# Patient Record
Sex: Female | Born: 1985 | Race: Black or African American | Hispanic: No | Marital: Single | Smoking: Never smoker
Health system: Southern US, Community
[De-identification: ages and names within clinical notes are randomized; demographics above are authoritative.]

---

## 2010-06-28 ENCOUNTER — Emergency Department (HOSPITAL_COMMUNITY)
Admission: EM | Admit: 2010-06-28 | Discharge: 2010-06-29 | Disposition: A | Discharge status: Home / Self Care | Payer: Self-pay | Attending: Emergency Medicine | Admitting: Emergency Medicine

## 2010-06-28 DIAGNOSIS — R109 Unspecified abdominal pain: Secondary | ICD-10-CM | POA: Insufficient documentation

## 2010-06-28 DIAGNOSIS — R110 Nausea: Secondary | ICD-10-CM | POA: Insufficient documentation

## 2010-06-28 LAB — DIFFERENTIAL
Eosinophils Absolute: 0 10*3/uL (ref 0.0–0.7)
Eosinophils Relative: 0 % (ref 0–5)
Lymphocytes Relative: 42 % (ref 12–46)
Lymphs Abs: 2.2 10*3/uL (ref 0.7–4.0)
Monocytes Absolute: 0.3 10*3/uL (ref 0.1–1.0)
Monocytes Relative: 6 % (ref 3–12)

## 2010-06-28 LAB — URINALYSIS, ROUTINE W REFLEX MICROSCOPIC
Bilirubin Urine: NEGATIVE
Ketones, ur: >80 mg/dL — AB
Nitrite: NEGATIVE
Specific Gravity, Urine: 1.021 (ref 1.005–1.030)
Urobilinogen, UA: 0.2 mg/dL (ref 0.0–1.0)

## 2010-06-28 LAB — COMPREHENSIVE METABOLIC PANEL
Alkaline Phosphatase: 58 U/L (ref 39–117)
BUN: 7 mg/dL (ref 6–23)
Creatinine, Ser: 0.81 mg/dL (ref 0.4–1.2)
Glucose, Bld: 101 mg/dL — ABNORMAL HIGH (ref 70–99)
Potassium: 3.3 mEq/L — ABNORMAL LOW (ref 3.5–5.1)
Total Protein: 7.1 g/dL (ref 6.0–8.3)

## 2010-06-28 LAB — CBC
HCT: 37.7 % (ref 36.0–46.0)
MCH: 28.2 pg (ref 26.0–34.0)
MCHC: 34 g/dL (ref 30.0–36.0)
MCV: 83 fL (ref 78.0–100.0)
Platelets: 300 10*3/uL (ref 150–400)
RDW: 16 % — ABNORMAL HIGH (ref 11.5–15.5)
WBC: 5.3 10*3/uL (ref 4.0–10.5)

## 2010-06-28 LAB — POCT PREGNANCY, URINE: Preg Test, Ur: NEGATIVE

## 2010-06-28 LAB — LIPASE, BLOOD: Lipase: 25 U/L (ref 11–59)

## 2012-10-19 ENCOUNTER — Encounter (HOSPITAL_BASED_OUTPATIENT_CLINIC_OR_DEPARTMENT_OTHER): Payer: Self-pay | Admitting: *Deleted

## 2012-10-19 ENCOUNTER — Emergency Department (HOSPITAL_BASED_OUTPATIENT_CLINIC_OR_DEPARTMENT_OTHER): Payer: Self-pay

## 2012-10-19 ENCOUNTER — Emergency Department (HOSPITAL_BASED_OUTPATIENT_CLINIC_OR_DEPARTMENT_OTHER)
Admission: EM | Admit: 2012-10-19 | Discharge: 2012-10-19 | Disposition: A | Discharge status: Home / Self Care | Payer: Self-pay | Attending: Emergency Medicine | Admitting: Emergency Medicine

## 2012-10-19 DIAGNOSIS — R42 Dizziness and giddiness: Secondary | ICD-10-CM | POA: Insufficient documentation

## 2012-10-19 DIAGNOSIS — R61 Generalized hyperhidrosis: Secondary | ICD-10-CM | POA: Insufficient documentation

## 2012-10-19 DIAGNOSIS — R51 Headache: Secondary | ICD-10-CM | POA: Insufficient documentation

## 2012-10-19 DIAGNOSIS — R0602 Shortness of breath: Secondary | ICD-10-CM | POA: Insufficient documentation

## 2012-10-19 DIAGNOSIS — R55 Syncope and collapse: Secondary | ICD-10-CM | POA: Insufficient documentation

## 2012-10-19 DIAGNOSIS — Z3202 Encounter for pregnancy test, result negative: Secondary | ICD-10-CM | POA: Insufficient documentation

## 2012-10-19 LAB — COMPREHENSIVE METABOLIC PANEL
AST: 14 U/L (ref 0–37)
Albumin: 3.3 g/dL — ABNORMAL LOW (ref 3.5–5.2)
CO2: 25 mEq/L (ref 19–32)
Calcium: 9.1 mg/dL (ref 8.4–10.5)
Creatinine, Ser: 0.7 mg/dL (ref 0.50–1.10)
GFR calc non Af Amer: >90 mL/min (ref >90)

## 2012-10-19 LAB — URINALYSIS, ROUTINE W REFLEX MICROSCOPIC
Bilirubin Urine: NEGATIVE
Hgb urine dipstick: NEGATIVE
Protein, ur: NEGATIVE mg/dL
Urobilinogen, UA: 0.2 mg/dL (ref 0.0–1.0)

## 2012-10-19 LAB — CBC WITH DIFFERENTIAL/PLATELET
Basophils Absolute: 0 10*3/uL (ref 0.0–0.1)
Basophils Relative: 0 % (ref 0–1)
Eosinophils Absolute: 0.1 10*3/uL (ref 0.0–0.7)
Eosinophils Relative: 1 % (ref 0–5)
HCT: 40.6 % (ref 36.0–46.0)
MCH: 31.2 pg (ref 26.0–34.0)
MCHC: 35.5 g/dL (ref 30.0–36.0)
MCV: 88.1 fL (ref 78.0–100.0)
Monocytes Absolute: 0.5 10*3/uL (ref 0.1–1.0)
RDW: 12.5 % (ref 11.5–15.5)

## 2012-10-19 MED ORDER — KETOROLAC TROMETHAMINE 30 MG/ML IJ SOLN
30.0000 mg | Freq: Once | INTRAMUSCULAR | Status: AC
  Administered 2012-10-19: 30 mg via INTRAVENOUS
  Filled 2012-10-19: qty 1

## 2012-10-19 MED ORDER — SODIUM CHLORIDE 0.9 % IV BOLUS (SEPSIS)
1000.0000 mL | Freq: Once | INTRAVENOUS | Status: AC
  Administered 2012-10-19: 1000 mL via INTRAVENOUS

## 2012-10-19 NOTE — ED Provider Notes (Signed)
This chart was scribed for Glynn Octave, MD, by Candelaria Stagers, ED Scribe. This patient was seen in room MH02/MH02 and the patient's care was started at 3:06 PM   History    CSN: [id number] Arrival date & time 10/19/12  1350  First MD Initiated Contact with Patient 10/19/12 1502     Chief Complaint  Patient presents with  . Anxiety    The history is provided by the patient. No language interpreter was used.   HPI Comments: Megan Alexander is a 27 y.o. female who presents to the Emergency Department BIBA complaining of an episode of SOB, diaphoresis, lightheadedness, and near syncope that started earlier today while seated at her desk at work earlier today lasting about 30 min.  Pt reports that once she sat down and the EMS arrived her breathing improved.  She reports she is experiencing a headache that started this morning upon waking which is unchanged.  Pt denies chest pain, abdominal pain, or syncope.  She reports eating and drinking normally.  She uses implanon birth control.  Pt denies h/o anxiety.     History reviewed. No pertinent past medical history. History reviewed. No pertinent past surgical history. History reviewed. No pertinent family history. History  Substance Use Topics  . Smoking status: Never Smoker   . Smokeless tobacco: Not on file  . Alcohol Use: No   OB History   Grav Para Term Preterm Abortions TAB SAB Ect Mult Living                 Review of Systems  Constitutional: Positive for diaphoresis.  Gastrointestinal: Negative for abdominal pain.  Neurological: Positive for light-headedness and headaches. Negative for syncope.  All other systems reviewed and are negative.    Allergies  Review of patient's allergies indicates no known allergies.  Home Medications  No current outpatient prescriptions on file. BP 138/91  Pulse 89  Temp(Src) 98.4 F (36.9 C)  Resp 16  Ht 5\' 1"  (1.549 m)  Wt 199 lb (90.266 kg)  BMI 37.62 kg/m2  SpO2 100% Physical  Exam  Nursing note and vitals reviewed. Constitutional: She is oriented to person, place, and time. She appears well-developed and well-nourished. No distress.  HENT:  Head: Normocephalic and atraumatic.  Right Ear: External ear normal.  Left Ear: External ear normal.  Nose: Nose normal.  Mouth/Throat: Oropharynx is clear and moist.  Eyes: Conjunctivae and EOM are normal. Pupils are equal, round, and reactive to light.  Neck: Normal range of motion. Neck supple.  No meningismus   Cardiovascular: Normal rate, regular rhythm, normal heart sounds and intact distal pulses.  Exam reveals no friction rub.   No murmur heard. Pulmonary/Chest: Effort normal and breath sounds normal. No respiratory distress. She has no wheezes. She has no rales.  Abdominal: Soft. Bowel sounds are normal.  Musculoskeletal: Normal range of motion.  Neurological: She is alert and oriented to person, place, and time. She has normal reflexes. No cranial nerve deficit.  CN 2-12 intact, no ataxia on finger to nose, no nystagmus, 5/5 strength throughout, no pronator drift, Romberg negative, normal gait.   Skin: Skin is warm and dry. She is not diaphoretic.  Psychiatric: She has a normal mood and affect. Her behavior is normal. Thought content normal.    ED Course  Procedures  DIAGNOSTIC STUDIES: Oxygen Saturation is 100% on room air, normal by my interpretation.    COORDINATION OF CARE:  3:29 PM Discussed course of care with pt which includes chest  xray and urinalysis.  Pt understands and agrees.   4:00 PM Discussed lab results and images with pt.    Labs Reviewed  COMPREHENSIVE METABOLIC PANEL - Abnormal; Notable for the following:    Glucose, Bld 101 (*)    Albumin 3.3 (*)    Total Bilirubin 0.2 (*)    All other components within normal limits  URINALYSIS, ROUTINE W REFLEX MICROSCOPIC  PREGNANCY, URINE  CBC WITH DIFFERENTIAL  D-DIMER, QUANTITATIVE   Dg Chest 2 View  10/19/2012   *RADIOLOGY REPORT*   Clinical Data: Shortness of breath.  CHEST - 2 VIEW  Comparison: None.  Findings: Heart size and pulmonary vascularity are normal and the lungs are clear.  No osseous abnormality.  IMPRESSION: Normal chest.   Original Report Authenticated By: Francene Boyers, M.D.   1. Near syncope     MDM  From work with gradual onset headache, dizziness, lightheadedness, shortness of breath and tingling in the fingers which are now resolved. No chest pain. Implanon birth control. No history of anxiety.  Nonfocal neuro exam. No hypoxia or tachycardia. We'll check d-dimer given birth control use.  Labs unremarkable. D-dimer negative. HCG negative. Urinalysis negative No arrhythmias an EKG. Orthostatics negative. Tolerating by mouth ambulatory. Suspect presyncope versus anxiety. No evidence of arrhythmia.   Date: 10/19/2012  Rate: 73  Rhythm: normal sinus rhythm and sinus arrhythmia  QRS Axis: normal  Intervals: normal  ST/T Wave abnormalities: normal  Conduction Disutrbances:none  Narrative Interpretation:   Old EKG Reviewed: none available   I personally performed the services described in this documentation, which was scribed in my presence. The recorded information has been reviewed and is accurate.       Glynn Octave, MD 10/19/12 1705

## 2012-10-19 NOTE — ED Notes (Signed)
Pt ambulated without any difficulty or dizziness.

## 2012-10-19 NOTE — ED Notes (Signed)
MD at bedside. 

## 2012-10-19 NOTE — ED Notes (Signed)
Pt brought in by ems  For increased RR and " tingling of fingers " while sitting at desk also c/o h/a and cold symptoms

## 2014-01-06 ENCOUNTER — Encounter (HOSPITAL_COMMUNITY): Payer: Self-pay | Admitting: Emergency Medicine

## 2014-01-06 ENCOUNTER — Emergency Department (HOSPITAL_COMMUNITY)
Admission: EM | Admit: 2014-01-06 | Discharge: 2014-01-06 | Disposition: A | Discharge status: Home / Self Care | Payer: 59 | Attending: Emergency Medicine | Admitting: Emergency Medicine

## 2014-01-06 ENCOUNTER — Emergency Department (HOSPITAL_COMMUNITY)
Admission: EM | Admit: 2014-01-06 | Discharge: 2014-01-06 | Discharge status: Against Medical Advice | Payer: 59 | Source: Home / Self Care

## 2014-01-06 DIAGNOSIS — R079 Chest pain, unspecified: Secondary | ICD-10-CM | POA: Diagnosis not present

## 2014-01-06 DIAGNOSIS — R110 Nausea: Secondary | ICD-10-CM | POA: Diagnosis not present

## 2014-01-06 DIAGNOSIS — R51 Headache: Secondary | ICD-10-CM | POA: Insufficient documentation

## 2014-01-06 DIAGNOSIS — Z79899 Other long term (current) drug therapy: Secondary | ICD-10-CM | POA: Diagnosis not present

## 2014-01-06 DIAGNOSIS — R519 Headache, unspecified: Secondary | ICD-10-CM

## 2014-01-06 DIAGNOSIS — Z3202 Encounter for pregnancy test, result negative: Secondary | ICD-10-CM | POA: Insufficient documentation

## 2014-01-06 LAB — BASIC METABOLIC PANEL
Anion gap: 11 (ref 5–15)
BUN: 11 mg/dL (ref 6–23)
CALCIUM: 9.3 mg/dL (ref 8.4–10.5)
CO2: 25 mEq/L (ref 19–32)
Chloride: 103 mEq/L (ref 96–112)
Creatinine, Ser: 0.72 mg/dL (ref 0.50–1.10)
GLUCOSE: 119 mg/dL — AB (ref 70–99)
POTASSIUM: 4.1 meq/L (ref 3.7–5.3)
SODIUM: 139 meq/L (ref 137–147)

## 2014-01-06 LAB — POC URINE PREG, ED: PREG TEST UR: NEGATIVE

## 2014-01-06 LAB — CBC
HEMATOCRIT: 42.6 % (ref 36.0–46.0)
HEMOGLOBIN: 14.4 g/dL (ref 12.0–15.0)
MCH: 30.8 pg (ref 26.0–34.0)
MCHC: 33.8 g/dL (ref 30.0–36.0)
MCV: 91 fL (ref 78.0–100.0)
Platelets: 314 10*3/uL (ref 150–400)
RBC: 4.68 MIL/uL (ref 3.87–5.11)
RDW: 13.6 % (ref 11.5–15.5)
WBC: 8.1 10*3/uL (ref 4.0–10.5)

## 2014-01-06 LAB — I-STAT TROPONIN, ED: Troponin i, poc: 0 ng/mL (ref 0.00–0.08)

## 2014-01-06 MED ORDER — DEXAMETHASONE SODIUM PHOSPHATE 10 MG/ML IJ SOLN
10.0000 mg | Freq: Once | INTRAMUSCULAR | Status: AC
  Administered 2014-01-06: 10 mg via INTRAVENOUS
  Filled 2014-01-06: qty 1

## 2014-01-06 MED ORDER — DIPHENHYDRAMINE HCL 50 MG/ML IJ SOLN
25.0000 mg | Freq: Once | INTRAMUSCULAR | Status: AC
  Administered 2014-01-06: 25 mg via INTRAVENOUS
  Filled 2014-01-06: qty 1

## 2014-01-06 MED ORDER — METOCLOPRAMIDE HCL 5 MG/ML IJ SOLN
10.0000 mg | Freq: Once | INTRAMUSCULAR | Status: AC
  Administered 2014-01-06: 10 mg via INTRAVENOUS
  Filled 2014-01-06: qty 2

## 2014-01-06 MED ORDER — SODIUM CHLORIDE 0.9 % IV BOLUS (SEPSIS)
1000.0000 mL | Freq: Once | INTRAVENOUS | Status: AC
  Administered 2014-01-06: 1000 mL via INTRAVENOUS

## 2014-01-06 NOTE — ED Notes (Signed)
MD at bedside. 

## 2014-01-06 NOTE — ED Provider Notes (Signed)
CSN: [id number]     Arrival date & time 01/06/14  1720 History   First MD Initiated Contact with Patient 01/06/14 2102     Chief Complaint  Patient presents with  . Chest Pain  . Migraine     (Consider location/radiation/quality/duration/timing/severity/associated sxs/prior Treatment) Patient is a 28 y.o. female presenting with headaches.  Headache Pain location:  Frontal Quality:  Dull Radiates to:  Does not radiate Severity currently:  10/10 Severity at highest:  10/10 Onset quality:  Gradual Duration:  10 hours Timing:  Constant Progression:  Worsening Chronicity:  New Context comment:  Initially started while at work.  Felt a little bit better with eating lunch, but then headache came back more severe.  Relieved by:  Nothing Exacerbated by: bending over. Ineffective treatments: excedrin. Associated symptoms: nausea   Associated symptoms: no abdominal pain, no blurred vision, no fever, no focal weakness, no neck pain, no neck stiffness, no photophobia, no visual change and no vomiting     History reviewed. No pertinent past medical history. History reviewed. No pertinent past surgical history. No family history on file. History  Substance Use Topics  . Smoking status: Never Smoker   . Smokeless tobacco: Not on file  . Alcohol Use: No   OB History   Grav Para Term Preterm Abortions TAB SAB Ect Mult Living                 Review of Systems  Constitutional: Negative for fever.  Eyes: Negative for blurred vision and photophobia.  Gastrointestinal: Positive for nausea. Negative for vomiting and abdominal pain.  Musculoskeletal: Negative for neck pain and neck stiffness.  Neurological: Positive for headaches. Negative for focal weakness.  All other systems reviewed and are negative.     Allergies  Review of patient's allergies indicates no known allergies.  Home Medications   Prior to Admission medications   Medication Sig Start Date End Date Taking?  Authorizing Provider  aspirin-acetaminophen-caffeine (EXCEDRIN MIGRAINE) [phone] MG per tablet Take 1 tablet by mouth every 6 (six) hours as needed for headache.   Yes Historical Provider, MD  etonogestrel (IMPLANON) 68 MG IMPL implant Inject 1 each into the skin continuous.   Yes Historical Provider, MD   BP 138/94  Pulse 76  Temp(Src) 97.9 F (36.6 C) (Oral)  Resp 18  SpO2 100% Physical Exam  Nursing note and vitals reviewed. Constitutional: She is oriented to person, place, and time. She appears well-developed and well-nourished. No distress.  HENT:  Head: Normocephalic and atraumatic.  Mouth/Throat: Oropharynx is clear and moist.  Eyes: Conjunctivae and EOM are normal. Pupils are equal, round, and reactive to light. No scleral icterus.  Fundoscopic exam:      The right eye shows no papilledema.       The left eye shows no papilledema.  Neck: Neck supple.  Cardiovascular: Normal rate, regular rhythm, normal heart sounds and intact distal pulses.   No murmur heard. Pulmonary/Chest: Effort normal and breath sounds normal. No stridor. No respiratory distress. She has no rales.  Abdominal: Soft. Bowel sounds are normal. She exhibits no distension. There is no tenderness.  Musculoskeletal: Normal range of motion.  Neurological: She is alert and oriented to person, place, and time. She has normal strength. No cranial nerve deficit or sensory deficit. Coordination and gait normal. GCS eye subscore is 4. GCS verbal subscore is 5. GCS motor subscore is 6.  Reflex Scores:      Patellar reflexes are 2+ on the right side  and 2+ on the left side. Skin: Skin is warm and dry. No rash noted.  Psychiatric: She has a normal mood and affect. Her behavior is normal.    ED Course  Procedures (including critical care time) Labs Review Labs Reviewed  BASIC METABOLIC PANEL - Abnormal; Notable for the following:    Glucose, Bld 119 (*)    All other components within normal limits  CBC  I-STAT  TROPOININ, ED  POC URINE PREG, ED    Imaging Review No results found.   EKG Interpretation   Date/Time:  Friday January 06 2014 18:39:17 EDT Ventricular Rate:  75 PR Interval:  152 QRS Duration: 89 QT Interval:  372 QTC Calculation: 415 R Axis:   84 Text Interpretation:  Sinus rhythm No significant change was found  Confirmed by [facility]  MD, TREY ([id number]) on 01/06/2014 9:03:20 PM      MDM   Final diagnoses:  Acute nonintractable headache, unspecified headache type    28yo female presenting with HA.  Gradual onset.  No fevers or neck stiffness.  Well appearing, sitting upright in bed with lights on, with normal neuro exam.  HA resolved with IV metoclopramide, diphenhydramine, dexamethasone, and fluids. She complained of some chest pain while she was in the waiting room, so protocol orders were placed for labs and EKG.  These were negative.  She thinks her transient CP was secondary to anxiety.  I have a very low suspicion for ACS, PE, dissection, or PNA.  She has PCP follow up in a few days.  Return precautions given.      Candyce Churn III, MD 01/06/14 [phone]

## 2014-01-06 NOTE — ED Notes (Signed)
Pt presents initially with c/o migraine, was seen at [facility] for the same last night. While waiting in triage room, pt reported chest pain in the middle of her chest associated with some lightheadedness. EKG performed on patient.

## 2014-01-06 NOTE — Discharge Instructions (Signed)

## 2014-01-06 NOTE — ED Notes (Signed)
Per pt sts headache since this am. sts she took Excedrin migraine but not helping. sts she feels hot.

## 2014-05-22 IMAGING — CR DG CHEST 2V
2 series · 2 of 2 positions shown · non-contrast
Comparison: None.

CLINICAL DATA: Shortness of breath.

CHEST - 2 VIEW

[w chest pa]
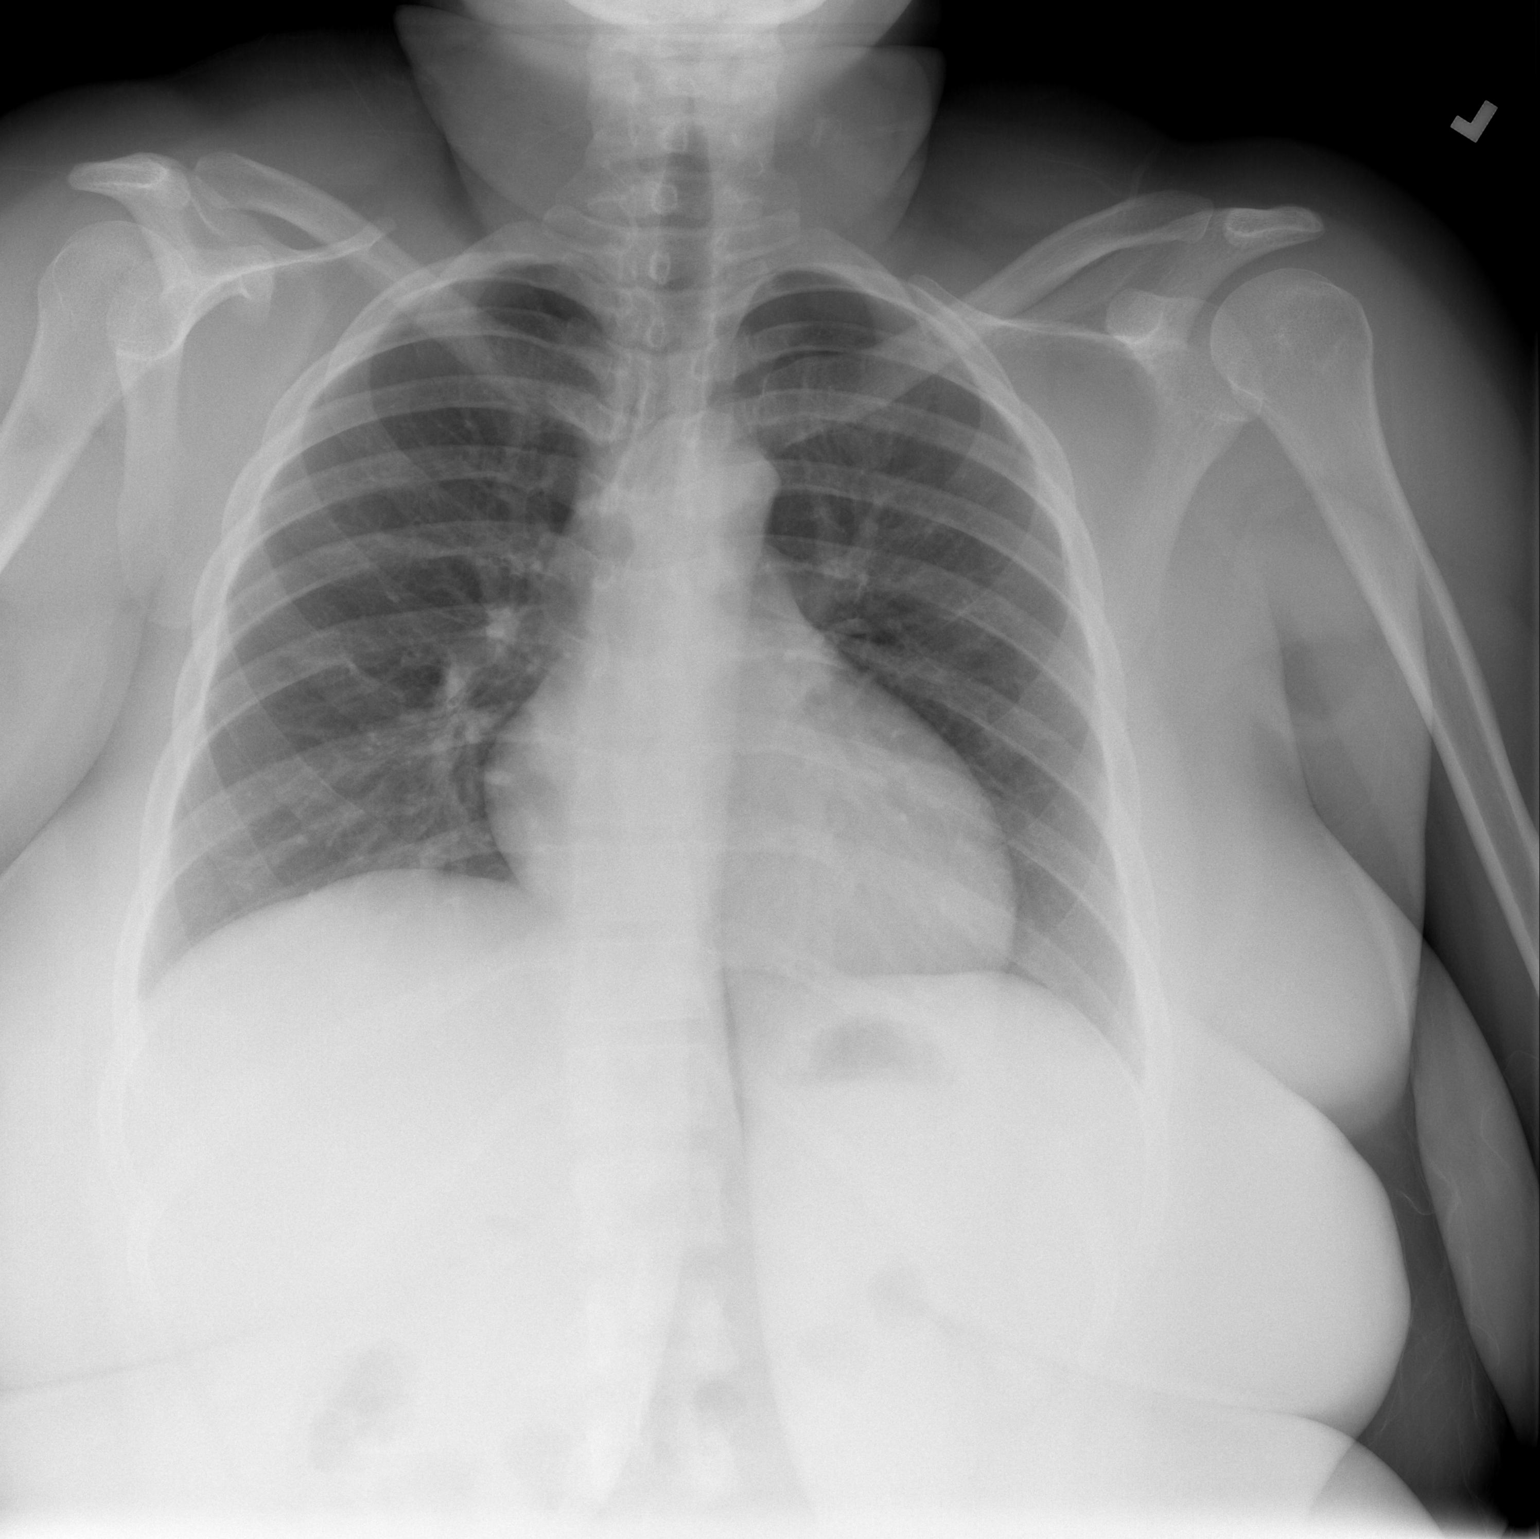

[w chest lat]
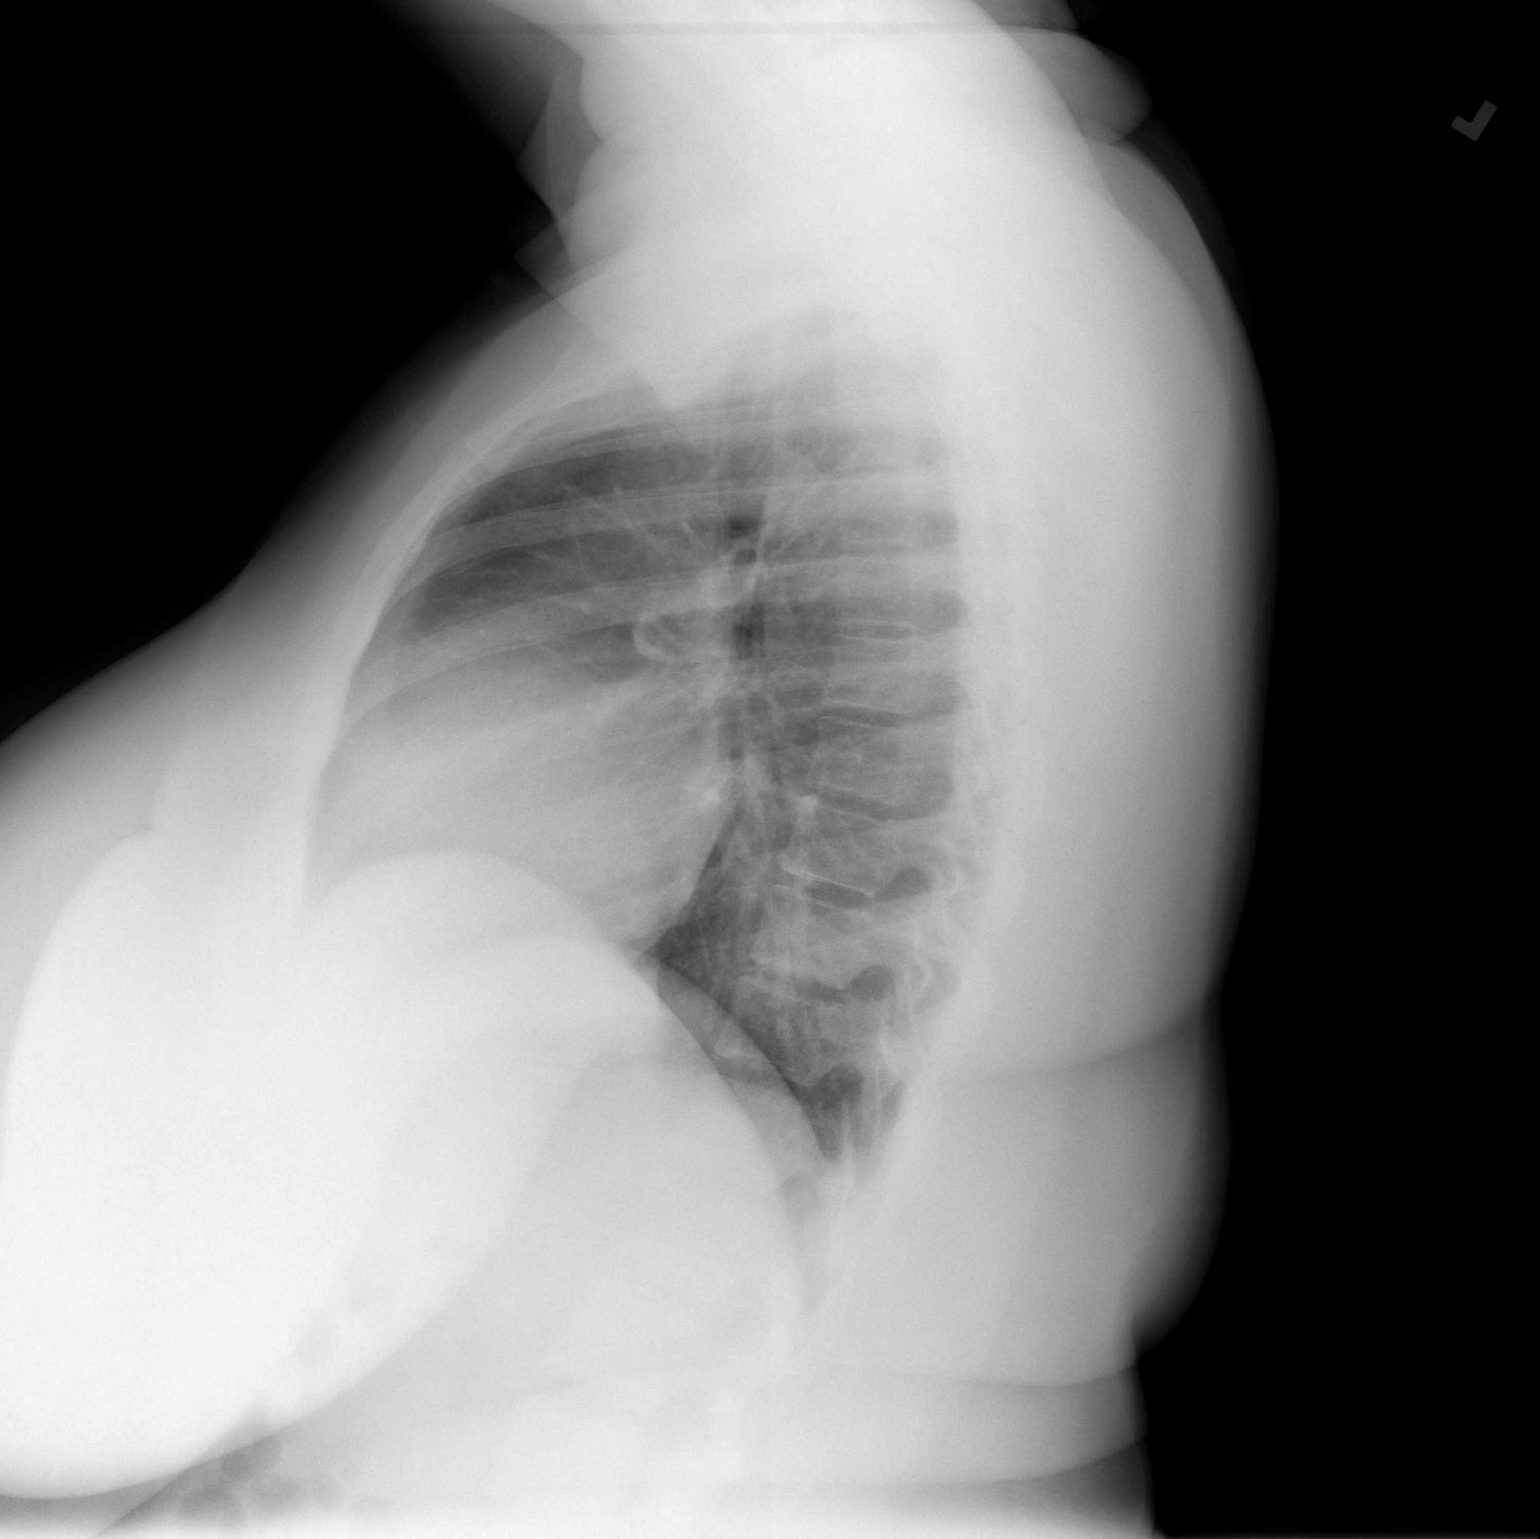

[2 of 2 positions shown; findings below may reference images not displayed]

FINDINGS: Heart size and pulmonary vascularity are normal and the
lungs are clear.  No osseous abnormality.
IMPRESSION: Normal chest.

## 2015-03-21 ENCOUNTER — Emergency Department (HOSPITAL_COMMUNITY)
Admission: EM | Admit: 2015-03-21 | Discharge: 2015-03-21 | Disposition: A | Discharge status: Home / Self Care | Payer: 59 | Attending: Emergency Medicine | Admitting: Emergency Medicine

## 2015-03-21 ENCOUNTER — Emergency Department (HOSPITAL_COMMUNITY): Payer: 59

## 2015-03-21 ENCOUNTER — Encounter (HOSPITAL_COMMUNITY): Payer: Self-pay | Admitting: Neurology

## 2015-03-21 DIAGNOSIS — B9789 Other viral agents as the cause of diseases classified elsewhere: Secondary | ICD-10-CM

## 2015-03-21 DIAGNOSIS — Z79899 Other long term (current) drug therapy: Secondary | ICD-10-CM | POA: Diagnosis not present

## 2015-03-21 DIAGNOSIS — J069 Acute upper respiratory infection, unspecified: Secondary | ICD-10-CM | POA: Diagnosis not present

## 2015-03-21 DIAGNOSIS — R05 Cough: Secondary | ICD-10-CM | POA: Diagnosis present

## 2015-03-21 DIAGNOSIS — J988 Other specified respiratory disorders: Secondary | ICD-10-CM

## 2015-03-21 DIAGNOSIS — Z3202 Encounter for pregnancy test, result negative: Secondary | ICD-10-CM | POA: Diagnosis not present

## 2015-03-21 DIAGNOSIS — M791 Myalgia: Secondary | ICD-10-CM | POA: Insufficient documentation

## 2015-03-21 LAB — CBC WITH DIFFERENTIAL/PLATELET
Basophils Absolute: 0 10*3/uL (ref 0.0–0.1)
Basophils Relative: 0 %
EOS PCT: 2 %
Eosinophils Absolute: 0.2 10*3/uL (ref 0.0–0.7)
HCT: 41.3 % (ref 36.0–46.0)
Hemoglobin: 14.2 g/dL (ref 12.0–15.0)
LYMPHS PCT: 27 %
Lymphs Abs: 2 10*3/uL (ref 0.7–4.0)
MCH: 31.1 pg (ref 26.0–34.0)
MCHC: 34.4 g/dL (ref 30.0–36.0)
MCV: 90.4 fL (ref 78.0–100.0)
MONOS PCT: 8 %
Monocytes Absolute: 0.6 10*3/uL (ref 0.1–1.0)
Neutro Abs: 4.6 10*3/uL (ref 1.7–7.7)
Neutrophils Relative %: 63 %
Platelets: 291 10*3/uL (ref 150–400)
RBC: 4.57 MIL/uL (ref 3.87–5.11)
RDW: 13.7 % (ref 11.5–15.5)
WBC: 7.3 10*3/uL (ref 4.0–10.5)

## 2015-03-21 LAB — BASIC METABOLIC PANEL
Anion gap: 6 (ref 5–15)
BUN: 5 mg/dL — AB (ref 6–20)
CO2: 25 mmol/L (ref 22–32)
CREATININE: 0.75 mg/dL (ref 0.44–1.00)
Calcium: 8.9 mg/dL (ref 8.9–10.3)
Chloride: 108 mmol/L (ref 101–111)
GFR calc Af Amer: >60 mL/min (ref >60)
GLUCOSE: 104 mg/dL — AB (ref 65–99)
POTASSIUM: 3.8 mmol/L (ref 3.5–5.1)
Sodium: 139 mmol/L (ref 135–145)

## 2015-03-21 LAB — I-STAT BETA HCG BLOOD, ED (MC, WL, AP ONLY): I-stat hCG, quantitative: <5 m[IU]/mL (ref <5)

## 2015-03-21 LAB — RAPID STREP SCREEN (MED CTR MEBANE ONLY): Streptococcus, Group A Screen (Direct): NEGATIVE

## 2015-03-21 MED ORDER — PSEUDOEPHEDRINE-GUAIFENESIN ER 60-600 MG PO TB12: 1.0000 | ORAL_TABLET | Freq: Two times a day (BID) | ORAL | Status: AC

## 2015-03-21 NOTE — ED Provider Notes (Signed)
CSN: [id number]     Arrival date & time 03/21/15  1206 History  By signing my name below, I, Megan Alexander, attest that this documentation has been prepared under the direction and in the presence of [employer], PA-C. Electronically Signed: [name], ED Scribe. 03/21/2015. 2:17 PM.  Chief Complaint  Patient presents with  . Cough  . Sore Throat   The history is provided by the patient. No language interpreter was used.     HPI Comments: Megan Alexander is a 29 y.o. female who presents to the Emergency Department complaining of flu like symptoms including myalgias, rhinorrhea, congestion, productive cough, and sore throat x 1 week. Pt has been taking Mucinex without relief. Denies fever, nausea, vomiting, urinary issues, abdominal pain, diarrhea, constipation, leg swelling, hemoptysis, or any other associated symptoms. No recent sick contact with similar symptoms. Pt did not receive a flu vaccine this year.   History reviewed. No pertinent past medical history. History reviewed. No pertinent past surgical history. No family history on file. Social History  Substance Use Topics  . Smoking status: Never Smoker   . Smokeless tobacco: None  . Alcohol Use: No   OB History    No data available     Review of Systems  Constitutional: Negative for fever.  HENT: Positive for congestion, rhinorrhea and sore throat.   Respiratory: Positive for cough.   Gastrointestinal: Negative for nausea, vomiting, abdominal pain, diarrhea and constipation.  Genitourinary: Negative for dysuria, frequency, hematuria and difficulty urinating.  Musculoskeletal: Positive for myalgias.  All other systems reviewed and are negative.     Allergies  Review of patient's allergies indicates no known allergies.  Home Medications   Prior to Admission medications   Medication Sig Start Date End Date Taking? Authorizing Provider  aspirin-acetaminophen-caffeine (EXCEDRIN MIGRAINE) [phone] MG per  tablet Take 1 tablet by mouth every 6 (six) hours as needed for headache.    Historical Provider, MD  etonogestrel (IMPLANON) 68 MG IMPL implant Inject 1 each into the skin continuous.    Historical Provider, MD  pseudoephedrine-guaifenesin (MUCINEX D) 60-600 MG 12 hr tablet Take 1 tablet by mouth every 12 (twelve) hours. 03/21/15   Megan Peek, PA-C   Triage Vitals: BP 138/94 mmHg  Pulse 83  Temp(Src) 99.3 F (37.4 C) (Oral)  Resp 14  SpO2 100%   Physical Exam  Constitutional: She is oriented to person, place, and time. She appears well-developed and well-nourished. No distress.  HENT:  Head: Normocephalic and atraumatic.  Eyes: Conjunctivae and EOM are normal.  Neck: Neck supple. No tracheal deviation present.  Cardiovascular: Normal rate, regular rhythm and normal heart sounds.   Pulmonary/Chest: Effort normal. No respiratory distress.  Musculoskeletal: Normal range of motion. She exhibits no edema.  Neurological: She is alert and oriented to person, place, and time.  Skin: Skin is warm and dry.  Psychiatric: She has a normal mood and affect. Her behavior is normal.  Nursing note and vitals reviewed.   ED Course  Procedures (including critical care time)  DIAGNOSTIC STUDIES: Oxygen Saturation is 100% on RA, normal by my interpretation.    COORDINATION OF CARE: 2:06 PM-Discussed treatment plan with pt at bedside and pt agreed to plan.   Labs Review Labs Reviewed  BASIC METABOLIC PANEL - Abnormal; Notable for the following:    Glucose, Bld 104 (*)    BUN 5 (*)    All other components within normal limits  RAPID STREP SCREEN (NOT AT [facility])  CULTURE, GROUP A  STREP  CBC WITH DIFFERENTIAL/PLATELET  I-STAT BETA HCG BLOOD, ED ([facility], [facility], [facility] ONLY)    Imaging Review Dg Chest 2 View  03/21/2015  CLINICAL DATA:  Cough for 1 week. EXAM: CHEST  2 VIEW COMPARISON:  October 19, 2012. FINDINGS: The heart size and mediastinal contours are within normal limits. Both lungs are clear. No  pneumothorax or pleural effusion is noted. The visualized skeletal structures are unremarkable. IMPRESSION: No active cardiopulmonary disease. Electronically Signed   By: Lupita RaiderJames  Green Jr, M.D.   On: 03/21/2015 13:17   I have personally reviewed and evaluated these images and lab results as part of my medical decision-making.   EKG Interpretation None     Meds given in ED:  Medications - No data to display  New Prescriptions   PSEUDOEPHEDRINE-GUAIFENESIN (MUCINEX D) 60-600 MG 12 HR TABLET    Take 1 tablet by mouth every 12 (twelve) hours.   Filed Vitals:   03/21/15 1218 03/21/15 1351 03/21/15 1353  BP: 138/94 128/79   Pulse: 83 74   Temp: 99.3 F (37.4 C)  98.9 F (37.2 C)  TempSrc: Oral Oral Oral  Resp: 14 18   SpO2: 100% 99%     MDM  Marney SettingConstance Alexander is a 29 y.o. female presents for evaluation of symptoms consistent with URI. Likely viral etiology. No evidence of bacterial infection. Doubt pneumonia, PE. Benign cardiopulmonary exams. Labs are noncontributory. Chest x-ray shows no cardiopulmonary pathology. Will treat symptomatically with Mucinex D, encourage continued use of Tylenol and Motrin at home as needed for discomfort. Increase fluid intake and stay well rested. Follow-up with her PCP next week as needed for reevaluation. Strict return precautions given. Overall, patient appears well, nontoxic, hemodynamically stable with normal vital signs, afebrile and is appropriate for outpatient follow-up. Final diagnoses:  Viral respiratory illness     I personally performed the services described in this documentation, which was scribed in my presence. The recorded information has been reviewed and is accurate.      Megan PeekBenjamin Eleanor Gatliff, PA-C 03/21/15 1418  Benjiman CoreNathan Pickering, MD 03/22/15 [phone]

## 2015-03-21 NOTE — ED Notes (Signed)
Declined W/C at D/C and was escorted to lobby by RN. 

## 2015-03-21 NOTE — Discharge Instructions (Signed)
There does not appear to be an emergent cause for your symptoms at this time. Your likely experiencing a cold related to a virus. Your symptoms should resolve on their own. He may take your medications to help with your symptoms. Continue taking Tylenol Motrin as needed for your discomfort. Stay well hydrated and drink plenty of fluids. Follow-up with your doctor as needed in the next week. Return to ED for any new or worsening symptoms.  Viral Infections A viral infection can be caused by different types of viruses.Most viral infections are not serious and resolve on their own. However, some infections may cause severe symptoms and may lead to further complications. SYMPTOMS Viruses can frequently cause:  Minor sore throat.  Aches and pains.  Headaches.  Runny nose.  Different types of rashes.  Watery eyes.  Tiredness.  Cough.  Loss of appetite.  Gastrointestinal infections, resulting in nausea, vomiting, and diarrhea. These symptoms do not respond to antibiotics because the infection is not caused by bacteria. However, you might catch a bacterial infection following the viral infection. This is sometimes called a "superinfection." Symptoms of such a bacterial infection may include:  Worsening sore throat with pus and difficulty swallowing.  Swollen neck glands.  Chills and a high or persistent fever.  Severe headache.  Tenderness over the sinuses.  Persistent overall ill feeling (malaise), muscle aches, and tiredness (fatigue).  Persistent cough.  Yellow, green, or brown mucus production with coughing. HOME CARE INSTRUCTIONS   Only take over-the-counter or prescription medicines for pain, discomfort, diarrhea, or fever as directed by your caregiver.  Drink enough water and fluids to keep your urine clear or pale yellow. Sports drinks can provide valuable electrolytes, sugars, and hydration.  Get plenty of rest and maintain proper nutrition. Soups and broths with  crackers or rice are fine. SEEK IMMEDIATE MEDICAL CARE IF:   You have severe headaches, shortness of breath, chest pain, neck pain, or an unusual rash.  You have uncontrolled vomiting, diarrhea, or you are unable to keep down fluids.  You or your child has an oral temperature above 102 F (38.9 C), not controlled by medicine.  Your baby is older than 3 months with a rectal temperature of 102 F (38.9 C) or higher.  Your baby is 613 months old or younger with a rectal temperature of 100.4 F (38 C) or higher. MAKE SURE YOU:   Understand these instructions.  Will watch your condition.  Will get help right away if you are not doing well or get worse.   This information is not intended to replace advice given to you by your health care provider. Make sure you discuss any questions you have with your health care provider.   Document Released: 01/15/2005 Document Revised: 06/30/2011 Document Reviewed: 09/13/2014 Elsevier Interactive Patient Education [employer]2016 [employer]vier Inc.

## 2015-03-21 NOTE — ED Notes (Signed)
Pt reports for 1 week cough, sore throat, body aches, chills. Pt is a x 4. In NAD

## 2015-03-23 LAB — CULTURE, GROUP A STREP: Strep A Culture: NEGATIVE
